# Patient Record
Sex: Female | Born: 1970 | Race: White | Hispanic: No | Marital: Married | Smoking: Former smoker
Health system: Southern US, Community
[De-identification: ages and names within clinical notes are randomized; demographics above are authoritative.]

## PROBLEM LIST (undated history)

## (undated) DIAGNOSIS — F1021 Alcohol dependence, in remission: Secondary | ICD-10-CM

## (undated) DIAGNOSIS — Z8739 Personal history of other diseases of the musculoskeletal system and connective tissue: Secondary | ICD-10-CM

## (undated) DIAGNOSIS — C3490 Malignant neoplasm of unspecified part of unspecified bronchus or lung: Secondary | ICD-10-CM

## (undated) DIAGNOSIS — Z808 Family history of malignant neoplasm of other organs or systems: Secondary | ICD-10-CM

## (undated) DIAGNOSIS — B001 Herpesviral vesicular dermatitis: Secondary | ICD-10-CM

## (undated) DIAGNOSIS — R030 Elevated blood-pressure reading, without diagnosis of hypertension: Secondary | ICD-10-CM

## (undated) DIAGNOSIS — E785 Hyperlipidemia, unspecified: Secondary | ICD-10-CM

## (undated) DIAGNOSIS — Z8249 Family history of ischemic heart disease and other diseases of the circulatory system: Secondary | ICD-10-CM

## (undated) DIAGNOSIS — F419 Anxiety disorder, unspecified: Secondary | ICD-10-CM

## (undated) HISTORY — DX: Anxiety disorder, unspecified: F41.9

## (undated) HISTORY — DX: Personal history of other diseases of the musculoskeletal system and connective tissue: Z87.39

## (undated) HISTORY — DX: Family history of ischemic heart disease and other diseases of the circulatory system: Z82.49

## (undated) HISTORY — DX: Elevated blood-pressure reading, without diagnosis of hypertension: R03.0

## (undated) HISTORY — DX: Hyperlipidemia, unspecified: E78.5

## (undated) HISTORY — DX: Family history of malignant neoplasm of other organs or systems: Z80.8

## (undated) HISTORY — DX: Herpesviral vesicular dermatitis: B00.1

## (undated) HISTORY — PX: NO PAST SURGERIES: SHX2092

## (undated) HISTORY — DX: Alcohol dependence, in remission: F10.21

---

## 2007-10-21 ENCOUNTER — Other Ambulatory Visit: Admission: RE | Admit: 2007-10-21 | Discharge: 2007-10-21 | Payer: Self-pay | Admitting: Family Medicine

## 2010-06-11 ENCOUNTER — Other Ambulatory Visit (HOSPITAL_COMMUNITY)
Admission: RE | Admit: 2010-06-11 | Discharge: 2010-06-11 | Disposition: A | Discharge status: Home / Self Care | Payer: 59 | Source: Ambulatory Visit | Attending: Internal Medicine | Admitting: Internal Medicine

## 2010-06-11 DIAGNOSIS — Z113 Encounter for screening for infections with a predominantly sexual mode of transmission: Secondary | ICD-10-CM | POA: Insufficient documentation

## 2010-06-11 DIAGNOSIS — Z01419 Encounter for gynecological examination (general) (routine) without abnormal findings: Secondary | ICD-10-CM | POA: Insufficient documentation

## 2011-06-14 ENCOUNTER — Other Ambulatory Visit: Payer: Self-pay | Admitting: Family Medicine

## 2011-06-14 DIAGNOSIS — Z1231 Encounter for screening mammogram for malignant neoplasm of breast: Secondary | ICD-10-CM

## 2012-05-19 ENCOUNTER — Ambulatory Visit
Admission: RE | Admit: 2012-05-19 | Discharge: 2012-05-19 | Disposition: A | Discharge status: Home / Self Care | Payer: 59 | Source: Ambulatory Visit | Attending: Family Medicine | Admitting: Family Medicine

## 2012-05-19 DIAGNOSIS — Z1231 Encounter for screening mammogram for malignant neoplasm of breast: Secondary | ICD-10-CM

## 2013-05-06 ENCOUNTER — Other Ambulatory Visit: Payer: Self-pay

## 2013-05-06 DIAGNOSIS — Z1231 Encounter for screening mammogram for malignant neoplasm of breast: Secondary | ICD-10-CM

## 2013-05-24 ENCOUNTER — Ambulatory Visit
Admission: RE | Admit: 2013-05-24 | Discharge: 2013-05-24 | Disposition: A | Discharge status: Home / Self Care | Payer: 59 | Source: Ambulatory Visit

## 2013-05-24 DIAGNOSIS — Z1231 Encounter for screening mammogram for malignant neoplasm of breast: Secondary | ICD-10-CM

## 2016-05-29 ENCOUNTER — Other Ambulatory Visit: Payer: Self-pay | Admitting: Physician Assistant

## 2016-05-29 DIAGNOSIS — Z1231 Encounter for screening mammogram for malignant neoplasm of breast: Secondary | ICD-10-CM

## 2016-06-26 ENCOUNTER — Ambulatory Visit
Admission: RE | Admit: 2016-06-26 | Discharge: 2016-06-26 | Disposition: A | Discharge status: Home / Self Care | Payer: BLUE CROSS/BLUE SHIELD | Source: Ambulatory Visit | Attending: Physician Assistant | Admitting: Physician Assistant

## 2016-06-26 DIAGNOSIS — Z1231 Encounter for screening mammogram for malignant neoplasm of breast: Secondary | ICD-10-CM

## 2017-07-25 ENCOUNTER — Telehealth: Payer: Self-pay

## 2017-07-25 NOTE — Telephone Encounter (Signed)
Notes sent to scheduling [facility] an [facility].

## 2017-09-11 ENCOUNTER — Other Ambulatory Visit: Payer: Self-pay | Admitting: Physician Assistant

## 2017-09-11 DIAGNOSIS — Z1231 Encounter for screening mammogram for malignant neoplasm of breast: Secondary | ICD-10-CM

## 2017-09-29 ENCOUNTER — Encounter: Payer: Self-pay | Admitting: *Deleted

## 2017-09-29 ENCOUNTER — Encounter: Payer: Self-pay | Admitting: Cardiology

## 2017-09-29 ENCOUNTER — Ambulatory Visit (INDEPENDENT_AMBULATORY_CARE_PROVIDER_SITE_OTHER): Payer: BLUE CROSS/BLUE SHIELD | Admitting: Cardiology

## 2017-09-29 VITALS — BP 110/68 | HR 88 | Ht 66.0 in | Wt 142.1 lb

## 2017-09-29 DIAGNOSIS — Z8249 Family history of ischemic heart disease and other diseases of the circulatory system: Secondary | ICD-10-CM

## 2017-09-29 DIAGNOSIS — Z01812 Encounter for preprocedural laboratory examination: Secondary | ICD-10-CM

## 2017-09-29 DIAGNOSIS — I209 Angina pectoris, unspecified: Secondary | ICD-10-CM | POA: Diagnosis not present

## 2017-09-29 MED ORDER — METOPROLOL TARTRATE 50 MG PO TABS: 50.0000 mg | ORAL_TABLET | Freq: Once | ORAL | 0 refills | Status: AC

## 2017-09-29 NOTE — Patient Instructions (Signed)
Medication Instructions:  The current medical regimen is effective;  continue present plan and medications.  Labwork: Please have blood work prior to your CT scan. (BMP)  Testing/Procedures: Your physician has requested that you have coronary CT. Cardiac computed tomography (CT) is a painless test that uses an x-ray machine to take clear, detailed pictures of your heart. For further information please visit [email or web address]. Please follow instruction sheet as given.  Follow-Up: Follow up will be based on the results of the above testing.  Thank you for choosing [facility]!!

## 2017-09-29 NOTE — Progress Notes (Signed)
Cardiology Office Note:    Date:  09/29/2017   ID:  Jill Jensen, DOB [birth year], MRN [id number]  PCP:  Jill Irani, PA-C  Cardiologist:  No primary care provider on file.   Referring MD: Jill Irani, PA-C     History of Present Illness:    Jill Jensen is a 47 y.o. female here for evaluation of possible coronary disease given her family history of early CAD.  Her paternal grandfather died of myocardial infarction in his 30s.  Her father had MI in his 22s with bypass.  He had 5 brothers and they all have coronary artery disease and myocardial infarction.  Her cholesterol levels have been fairly elevated lifelong but she is usually had high LDL levels.  Previous alcohol use, sober for 3 years, goes to Alcoholics Anonymous.  She no longer smokes.  New LDL 158, total cholesterol 237, triglycerides 64, HDL 66.   Past 2 years some anxiety, left sided chest discomfort, squeezing sharp, come and goes, does not have to be exertional. No associated SOB. Nervous. ?Arm discomfort as well. Not during exercise.    Past Medical History:  Diagnosis Date  . Anxiety   . Elevated blood pressure reading without diagnosis of hypertension   . Family history of heart disease   . Family history of thyroid cancer   . H/O scoliosis   . Hyperlipidemia   . Recovering alcoholic (HCC)   . Recurrent cold sores     Past Surgical History:  Procedure Laterality Date  . NO PAST SURGERIES      Current Medications: Current Meds  Medication Sig  . tretinoin (RETIN-A) 0.05 % cream Apply topically at bedtime.  . valACYclovir (VALTREX) 1000 MG tablet Take 1,000 mg by mouth 2 (two) times daily.     Allergies:   Patient has no known allergies.   Social History   Socioeconomic History  . Marital status: Married    Spouse name: Not on file  . Number of children: Not on file  . Years of education: Not on file  . Highest education level: Not on file  Occupational History  . Not on file    Social Needs  . Financial resource strain: Not on file  . Food insecurity:    Worry: Not on file    Inability: Not on file  . Transportation needs:    Medical: Not on file    Non-medical: Not on file  Tobacco Use  . Smoking status: Former Games developer  . Smokeless tobacco: Never Used  Substance and Sexual Activity  . Alcohol use: Never    Frequency: Never  . Drug use: Never  . Sexual activity: Yes    Comment: married  Lifestyle  . Physical activity:    Days per week: Not on file    Minutes per session: Not on file  . Stress: Not on file  Relationships  . Social connections:    Talks on phone: Not on file    Gets together: Not on file    Attends religious service: Not on file    Active member of club or organization: Not on file    Attends meetings of clubs or organizations: Not on file    Relationship status: Not on file  Other Topics Concern  . Not on file  Social History Narrative  . Not on file     Family History: The patient's family history includes Breast cancer in her maternal grandmother; Cancer in her sister; Cancer - Prostate in  her father; Heart attack (age of onset: 3750) in her father; Heart failure in her maternal grandfather; Hyperlipidemia in her father; Hypertension in her mother.  ROS:   Please see the history of present illness.     All other systems reviewed and are negative.  EKGs/Labs/Other Studies Reviewed:    The following studies were reviewed today: Prior office notes, lab work reviewed EKG reviewed.  EKG:  EKG is  ordered today.  The ekg ordered today demonstrates 09/29/2017-sinus rhythm 70 nonspecific T wave flattening mildly low voltage.  Recent Labs: No results found for requested labs within last 8760 hours.  Recent Lipid Panel No results found for: CHOL, TRIG, HDL, CHOLHDL, VLDL, LDLCALC, LDLDIRECT  Physical Exam:    VS:  BP 110/68   Pulse 88   Ht 5\' 6"  (1.676 m)   Wt 142 lb 1.9 oz (64.5 kg)   SpO2 98%   BMI 22.94 kg/m     Wt  Readings from Last 3 Encounters:  09/29/17 142 lb 1.9 oz (64.5 kg)     GEN:  Well nourished, well developed in no acute distress HEENT: Normal NECK: No JVD; No carotid bruits LYMPHATICS: No lymphadenopathy CARDIAC: RRR, no murmurs, rubs, gallops RESPIRATORY:  Clear to auscultation without rales, wheezing or rhonchi  ABDOMEN: Soft, non-tender, non-distended MUSCULOSKELETAL:  No edema; No deformity  SKIN: Warm and dry NEUROLOGIC:  Alert and oriented x 3 PSYCHIATRIC:  Normal affect   ASSESSMENT:    1. Family history of coronary arteriosclerosis   2. Angina pectoris (HCC)   3. Pre-procedure lab exam    PLAN:    In order of problems listed above:  Chest discomfort/angina - Given her strong early family history of CAD, lifelong elevated cholesterol levels, squeezing like left-sided chest discomfort intermittent, I would like to pursue CT scan of coronary arteries with FFR to hopefully exclude coronary artery disease.  Obviously if there is atherosclerosis/calcium, I think given her strong family history I would make sense for us to start a statin therapy at that time.  For now, let us see what the CT scan shows.  Lopressor 50 prior to CT scan.  Continue with healthy eating, non-processed foods, exercise.   Medication Adjustments/Labs and Tests Ordered: Current medicines are reviewed at length with the patient today.  Concerns regarding medicines are outlined above.  Orders Placed This Encounter  Procedures  . CT CORONARY MORPH W/CTA COR W/SCORE W/CA W/CM &/OR WO/CM  . CT CORONARY FRACTIONAL FLOW RESERVE DATA PREP  . CT CORONARY FRACTIONAL FLOW RESERVE FLUID ANALYSIS  . Basic metabolic panel  . EKG 12-Lead   Meds ordered this encounter  Medications  . metoprolol tartrate (LOPRESSOR) 50 MG tablet    Sig: Take 1 tablet (50 mg total) by mouth once for 1 dose. Take 1 tablet 1 hour prior to your CT scan    Dispense:  1 tablet    Refill:  0    Patient Instructions  Medication  Instructions:  The current medical regimen is effective;  continue present plan and medications.  Labwork: Please have blood work prior to your CT scan. (BMP)  Testing/Procedures: Your physician has requested that you have coronary CT. Cardiac computed tomography (CT) is a painless test that uses an x-ray machine to take clear, detailed pictures of your heart. For further information please visit [email or web address]. Please follow instruction sheet as given.  Follow-Up: Follow up will be based on the results of the above testing.  Thank you for choosing [facility]!!        Signed, Donato SchultzMark Lilianna Case, MD  09/29/2017 12:08 PM    [facility]

## 2017-10-03 ENCOUNTER — Ambulatory Visit: Payer: BLUE CROSS/BLUE SHIELD

## 2017-11-18 NOTE — Progress Notes (Signed)
Pt decided she does not want to have this testing as this time.

## 2018-11-27 ENCOUNTER — Other Ambulatory Visit: Payer: Self-pay | Admitting: Physician Assistant

## 2018-11-27 DIAGNOSIS — Z1231 Encounter for screening mammogram for malignant neoplasm of breast: Secondary | ICD-10-CM

## 2018-11-30 ENCOUNTER — Ambulatory Visit
Admission: RE | Admit: 2018-11-30 | Discharge: 2018-11-30 | Disposition: A | Discharge status: Home / Self Care | Payer: BLUE CROSS/BLUE SHIELD | Source: Ambulatory Visit | Attending: Physician Assistant | Admitting: Physician Assistant

## 2018-11-30 ENCOUNTER — Other Ambulatory Visit: Payer: Self-pay

## 2018-11-30 DIAGNOSIS — Z1231 Encounter for screening mammogram for malignant neoplasm of breast: Secondary | ICD-10-CM

## 2019-04-01 ENCOUNTER — Ambulatory Visit: Payer: BC Managed Care – PPO

## 2019-12-14 ENCOUNTER — Other Ambulatory Visit: Payer: Self-pay | Admitting: Physician Assistant

## 2019-12-14 DIAGNOSIS — Z1231 Encounter for screening mammogram for malignant neoplasm of breast: Secondary | ICD-10-CM

## 2019-12-22 ENCOUNTER — Ambulatory Visit
Admission: RE | Admit: 2019-12-22 | Discharge: 2019-12-22 | Disposition: A | Discharge status: Home / Self Care | Payer: BC Managed Care – PPO | Source: Ambulatory Visit

## 2019-12-22 ENCOUNTER — Other Ambulatory Visit: Payer: Self-pay

## 2019-12-22 DIAGNOSIS — Z1231 Encounter for screening mammogram for malignant neoplasm of breast: Secondary | ICD-10-CM

## 2019-12-24 ENCOUNTER — Other Ambulatory Visit: Payer: Self-pay | Admitting: Physician Assistant

## 2019-12-24 DIAGNOSIS — R928 Other abnormal and inconclusive findings on diagnostic imaging of breast: Secondary | ICD-10-CM

## 2020-01-01 ENCOUNTER — Ambulatory Visit
Admission: RE | Admit: 2020-01-01 | Discharge: 2020-01-01 | Disposition: A | Discharge status: Home / Self Care | Payer: BC Managed Care – PPO | Source: Ambulatory Visit | Attending: Physician Assistant | Admitting: Physician Assistant

## 2020-01-01 ENCOUNTER — Ambulatory Visit: Payer: BC Managed Care – PPO

## 2020-01-01 ENCOUNTER — Other Ambulatory Visit: Payer: Self-pay

## 2020-01-01 DIAGNOSIS — R928 Other abnormal and inconclusive findings on diagnostic imaging of breast: Secondary | ICD-10-CM

## 2020-02-24 ENCOUNTER — Other Ambulatory Visit: Payer: BC Managed Care – PPO

## 2021-01-01 ENCOUNTER — Other Ambulatory Visit: Payer: Self-pay | Admitting: Physician Assistant

## 2021-01-01 DIAGNOSIS — Z1231 Encounter for screening mammogram for malignant neoplasm of breast: Secondary | ICD-10-CM

## 2021-02-01 ENCOUNTER — Ambulatory Visit
Admission: RE | Admit: 2021-02-01 | Discharge: 2021-02-01 | Disposition: A | Discharge status: Home / Self Care | Payer: BC Managed Care – PPO | Source: Ambulatory Visit

## 2021-02-01 DIAGNOSIS — Z1231 Encounter for screening mammogram for malignant neoplasm of breast: Secondary | ICD-10-CM

## 2021-04-24 IMAGING — MG MM DIGITAL DIAGNOSTIC UNILAT*R* W/ TOMO W/ CAD
8 series · 9 of 24 positions shown · non-contrast
Comparison: Previous exams.

CLINICAL DATA: Screening recall for possible right breast
asymmetry.

EXAM:
DIGITAL DIAGNOSTIC UNILATERAL RIGHT MAMMOGRAM WITH TOMO AND CAD

[R ML synth-2D (1 of 2)]
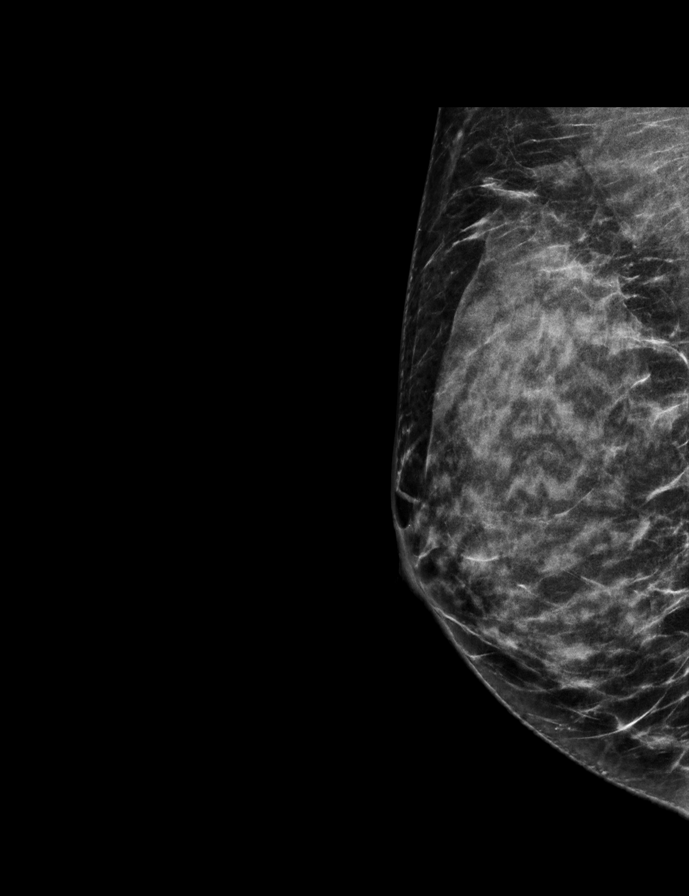

[R MLO synth-2D (1 of 2)]
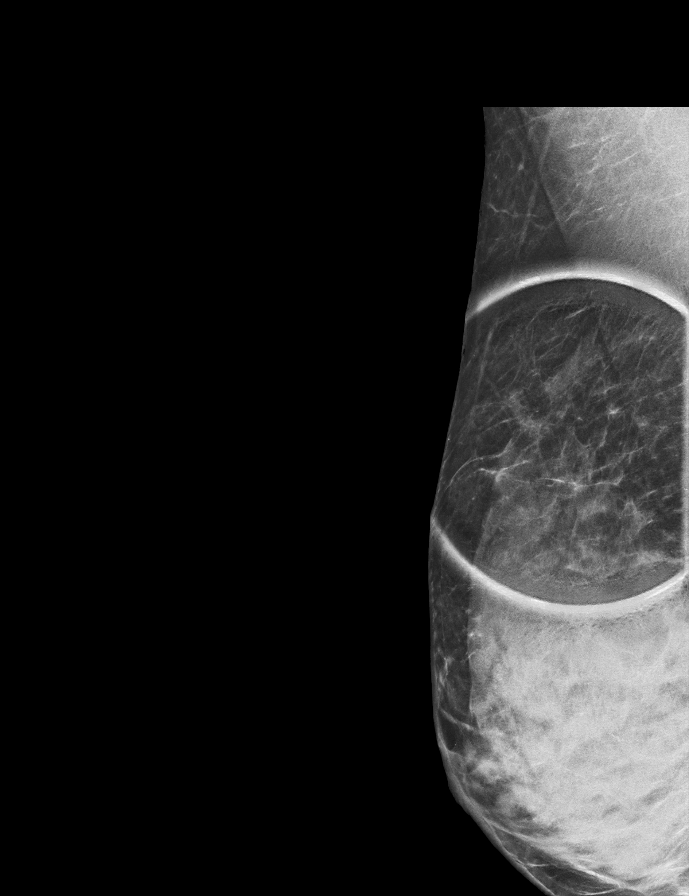

[R ML synth-2D (2 of 2)]
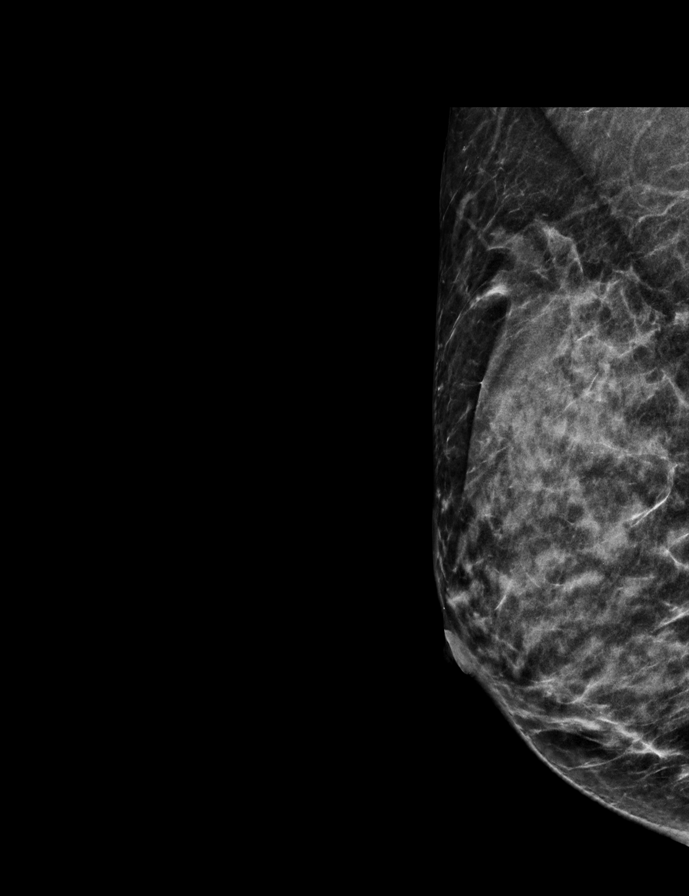

[R MLO synth-2D (2 of 2)]
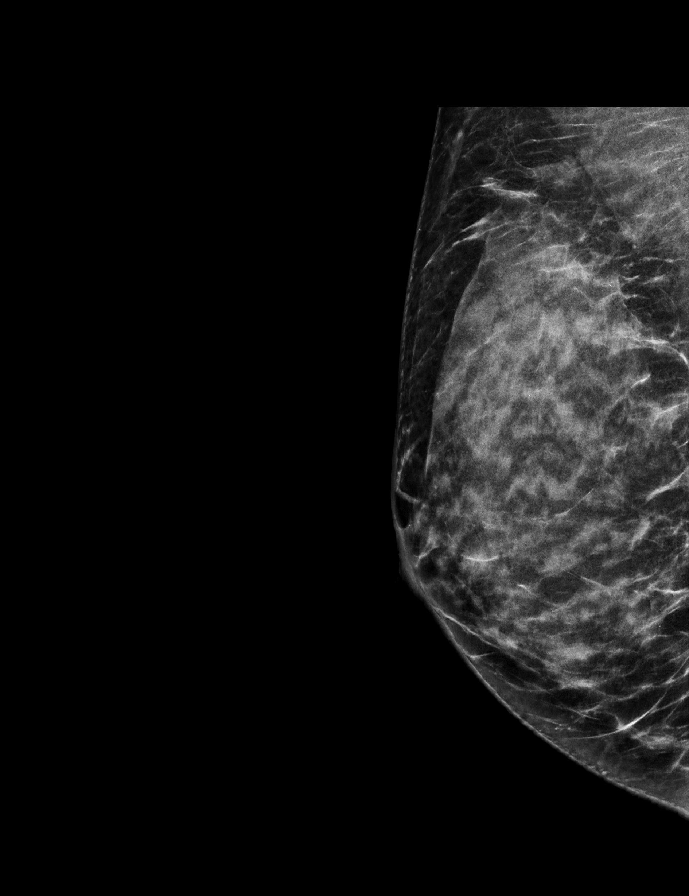

[R ML tomo · 2 of 56 frames shown (1 of 2)]
[frame 19/56]
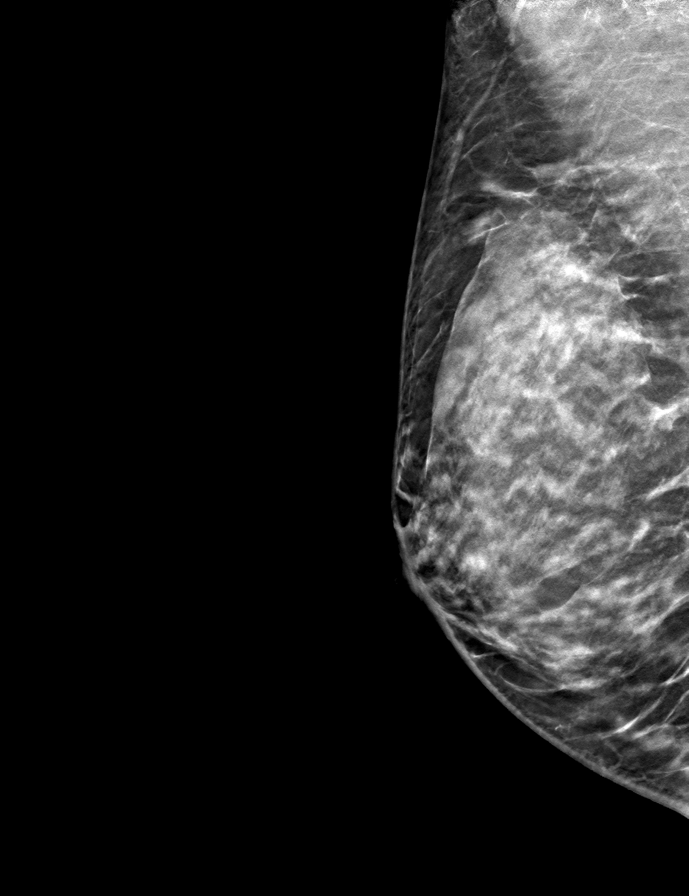
[frame 29/56]
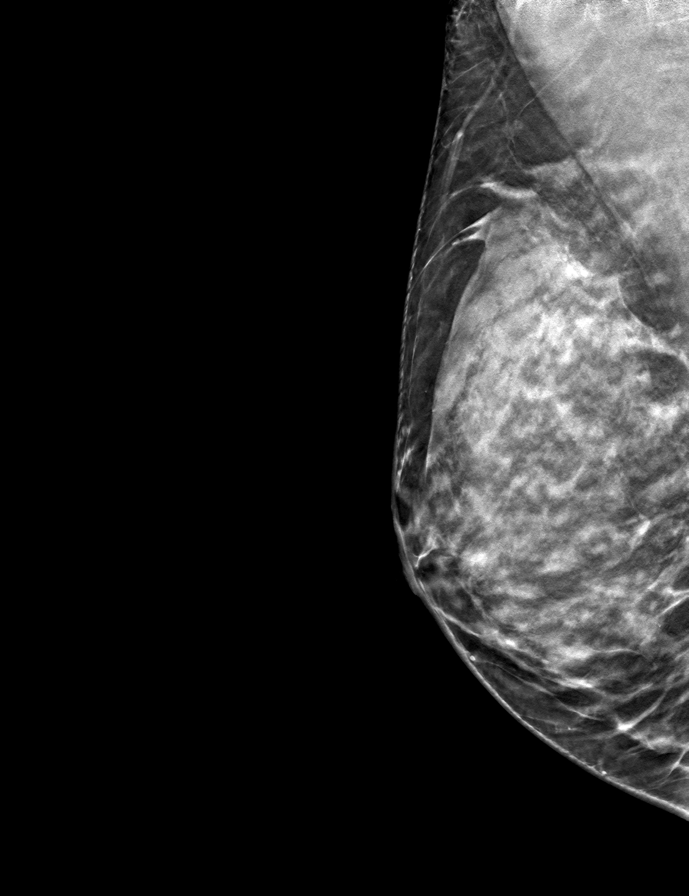

[R MLO tomo (1 of 2) · tomo slice 29/56.0]
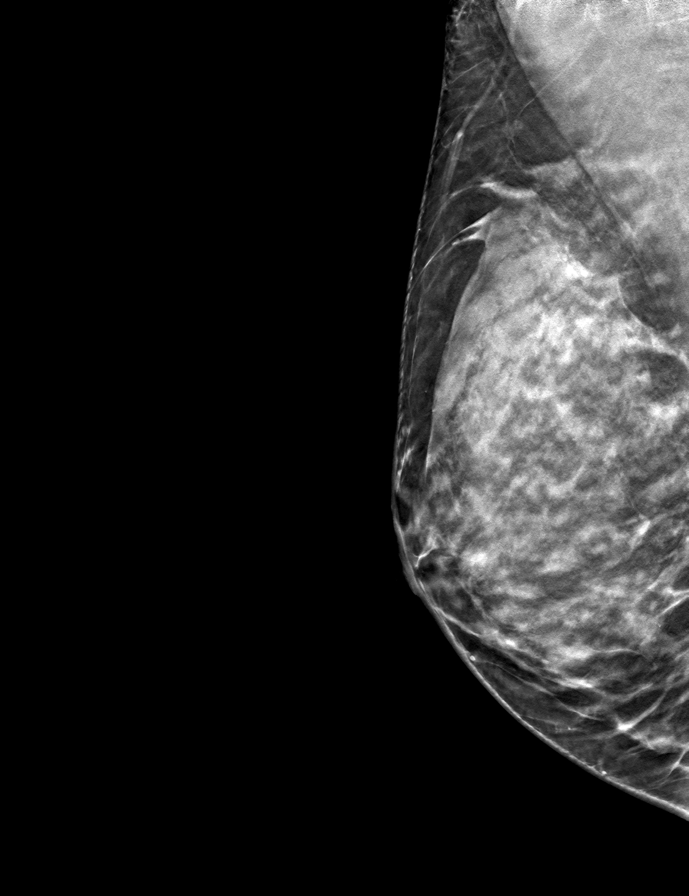

[R MLO tomo (2 of 2) · tomo slice 27/52.0]
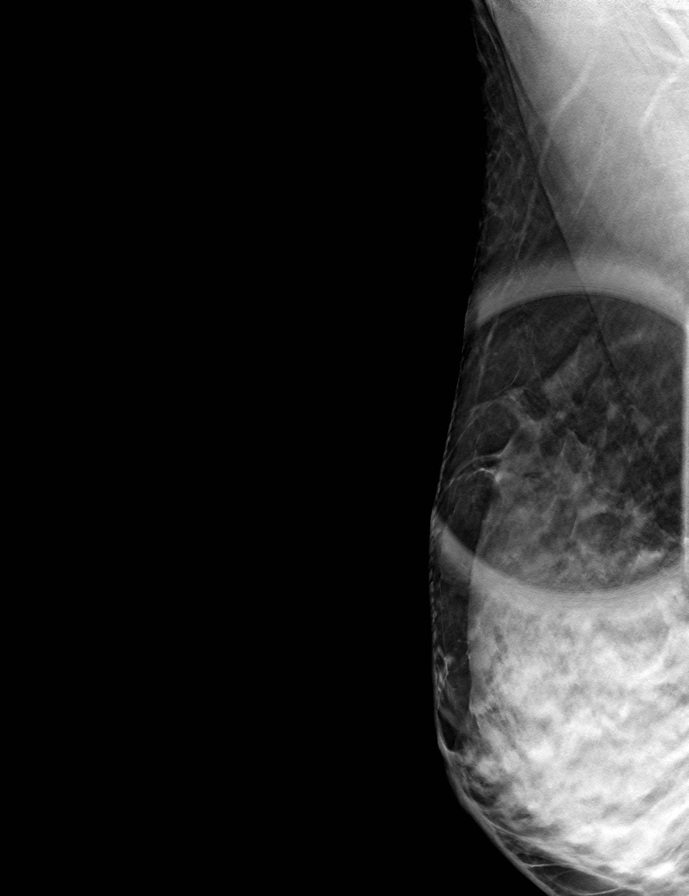

[R ML tomo (2 of 2) · tomo slice 27/53.0]
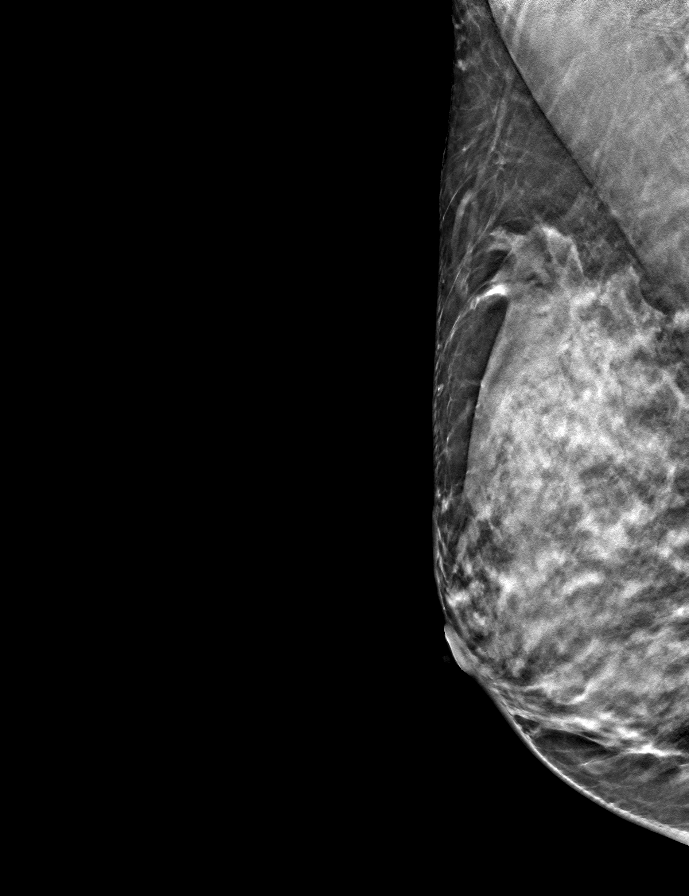

[9 of 24 positions shown; findings below may reference images not displayed]

ACR Breast Density Category d: The breast tissue is extremely dense,
which lowers the sensitivity of mammography.
FINDINGS: Additional tomograms were performed of the right breast. The
initially questioned possible right breast asymmetry resolves on the
additional imaging with findings compatible with an area of
overlapping fibroglandular tissue. There is no mammographic evidence
of malignancy in the right breast.

Mammographic images were processed with CAD.
IMPRESSION: No mammographic evidence of malignancy in the right breast.

RECOMMENDATION:
Screening mammogram in one year.(Code:36-E-BHA)

I have discussed the findings and recommendations with the patient.
If applicable, a reminder letter will be sent to the patient
regarding the next appointment.

BI-RADS CATEGORY  1: Negative.

## 2022-04-18 ENCOUNTER — Encounter (HOSPITAL_BASED_OUTPATIENT_CLINIC_OR_DEPARTMENT_OTHER): Payer: Self-pay | Admitting: Pulmonary Disease

## 2022-04-18 ENCOUNTER — Ambulatory Visit (INDEPENDENT_AMBULATORY_CARE_PROVIDER_SITE_OTHER): Payer: BC Managed Care – PPO | Admitting: Pulmonary Disease

## 2022-04-18 VITALS — BP 110/70 | HR 82 | Wt 134.0 lb

## 2022-04-18 DIAGNOSIS — R911 Solitary pulmonary nodule: Secondary | ICD-10-CM

## 2022-04-18 NOTE — Patient Instructions (Addendum)
RUL nodule, 1 cm No prior imaging available --OBTAIN disc from [place] for PET/CT

## 2022-04-18 NOTE — Telephone Encounter (Signed)
Dr. Loanne Drilling, please see mychart sent by pt.

## 2022-04-18 NOTE — Progress Notes (Addendum)
Subjective:   PATIENT ID: Jill Jensen GENDER: female DOB: 1970/06/15, MRN: [id number]  Chief Complaint  Patient presents with   Consult    Spot on lung    Reason for Visit: New consult for Pulmonary Nodule  Jill Jensen is a 52 year old female with a PMHx of HLD, elevated blood pressures, and alcohol use (8 years ago) who presents after patient was found to have a nodule found on a coronary calcium test. This nodule was an irregular 1 cm nodule in the inferior right upper lobe at the level of the hilum. Patient reports no shortness of breath or chest pain.   Medical History: HLD   Meds: Crestor 5 mg daily, Tretinoin, Valtrex 1000 mg  Family History:Father: Prostate cancer (15 years ago) MI, HLD, All uncles: MI, and HLD, Maternal grandmother (Hormone induced Breast Ca), Sister (Thyroid nodule?)  Social History:  Alcohol: Stopped 8 years  Tob: Social smoker former back 30 years ago (2016, 6-8 weeks of 5 cigaertetes a day)   Did not have an childhood exposures. No current household smokers. No factories near.   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   I have personally reviewed patient's past medical/family/social history, allergies, current medications.  Past Medical History:  Diagnosis Date   Anxiety    Elevated blood pressure reading without diagnosis of hypertension    Family history of heart disease    Family history of thyroid cancer    H/O scoliosis    Hyperlipidemia    Recovering alcoholic ([place])    Recurrent cold sores      Family History  Problem Relation Age of Onset   Breast cancer Maternal Grandmother    Hypertension Mother    Hyperlipidemia Father    Cancer - Prostate Father    Heart attack Father 40       had cabg   Cancer Sister        thyroid   Heart failure Maternal Grandfather      Social History   Occupational History   Not on file  Tobacco Use   Smoking status: Former    Types:  Cigarettes   Smokeless tobacco: Never   Tobacco comments:    Would consider herself a social smoker only at night about 5 to 8 cigarettes a week. Last times smoked 2016  Substance and Sexual Activity   Alcohol use: Never   Drug use: Never   Sexual activity: Yes    Comment: married    No Known Allergies   Outpatient Medications Prior to Visit  Medication Sig Dispense Refill   rosuvastatin (CRESTOR) 5 MG tablet Take 5 mg by mouth daily.     tretinoin (RETIN-A) 0.05 % cream Apply topically at bedtime.     valACYclovir (VALTREX) 1000 MG tablet Take 1,000 mg by mouth 2 (two) times daily.     metoprolol tartrate (LOPRESSOR) 50 MG tablet Take 1 tablet (50 mg total) by mouth once for 1 dose. Take 1 tablet 1 hour prior to your CT scan 1 tablet 0   No facility-administered medications prior to visit.    Review of Systems  Constitutional:  Negative for chills and fever.  Respiratory:  Negative for cough, hemoptysis, shortness of breath and wheezing.   Cardiovascular:  Negative for chest pain.   Objective:   Vitals:   04/18/22 1505  BP: 110/70  Pulse: 82  SpO2: 96%  Weight: 134 lb (60.8 kg)   SpO2: 96 %  O2 Device: None (Room air)  Physical Exam: General: Resting in chair in no acute distress HENT: Normocephalic, atraumatic  Eyes: EOMI Respiratory: CTAB, no wheezing, rales, or ronchi noted  Cardiovascular: RRR, no murmurs, rubs, or gallops Psych: Normal mood, normal affect  Data Reviewed:  Imaging: CT Coronary Calcium scoring 03/27/22: Irregular Right upper 1 cm pulmonary nodules   PFT: N/A  Labs:  N/A    Assessment & Plan:   Discussion: This is a 52 year old female with a past medical history of HLD and elevated blood pressures who presents after incidental finding of irregular 1 cm right upper lobe nodule. Patient is not symptomatic at all. On exam, patient has clear lung sounds. Patient does not have too many concerning risk factors as she is not a long time smoker.  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 As it is this >75m and irregular, will want to proceed with more workup to see if this is actively growing.   1 cm irregular right upper lobe pulmonary nodule -Will proceed with PET scan of chest  -If active will need CT chest and likely navigational bronchoscopy  -Follow up in about 4 weeks after the imaging   Health Maintenance Immunization History  Administered Date(s) Administered   PFIZER(Purple Top)SARS-COV-2 Vaccination 01/14/2020    Orders Placed This Encounter  Procedures   NM PET Image Initial (PI) Skull Base To Thigh    Standing Status:   Future    Standing Expiration Date:   04/18/2023    Order Specific Question:   If indicated for the ordered procedure, I authorize the administration of a radiopharmaceutical per Radiology protocol    Answer:   Yes    Order Specific Question:   Is the patient pregnant?    Answer:   No    Order Specific Question:   [facility]?    [facility]   [facility]  No orders of the defined types were placed in this encounter.   No follow-ups on file.  I have spent a total time of 40-minutes on the day of the appointment reviewing prior documentation, coordinating care and discussing medical diagnosis and plan with the patient/family. Imaging, labs and tests included in this note have been reviewed and interpreted independently by me.  ALeigh Aurora DO [facility] 04/18/2022 3:44 PM  Office Number [phone] Medicine Resident PGY-1

## 2022-04-19 ENCOUNTER — Encounter (HOSPITAL_BASED_OUTPATIENT_CLINIC_OR_DEPARTMENT_OTHER): Payer: Self-pay | Admitting: Pulmonary Disease

## 2022-04-19 NOTE — Telephone Encounter (Signed)
Disc dropped off by pt.

## 2022-04-19 NOTE — Progress Notes (Signed)
See attestation on 04/18/22 note

## 2022-05-01 NOTE — Telephone Encounter (Signed)
CD received and image of CT cardiac calcium scan 03/27/22 uploaded to PACS via [place]. Image is available for viewing in patients chart/ PACS.

## 2022-05-07 ENCOUNTER — Ambulatory Visit (HOSPITAL_COMMUNITY)
Admission: RE | Admit: 2022-05-07 | Discharge: 2022-05-07 | Disposition: A | Discharge status: Home / Self Care | Payer: BC Managed Care – PPO | Source: Ambulatory Visit | Attending: Pulmonary Disease | Admitting: Pulmonary Disease

## 2022-05-07 DIAGNOSIS — R911 Solitary pulmonary nodule: Secondary | ICD-10-CM

## 2022-05-07 LAB — GLUCOSE, CAPILLARY: Glucose-Capillary: 78 mg/dL (ref 70–99)

## 2022-05-07 MED ORDER — FLUDEOXYGLUCOSE F - 18 (FDG) INJECTION
6.6300 | Freq: Once | INTRAVENOUS | Status: AC | PRN
  Administered 2022-05-07: 6.63 via INTRAVENOUS

## 2022-05-09 ENCOUNTER — Ambulatory Visit (INDEPENDENT_AMBULATORY_CARE_PROVIDER_SITE_OTHER): Payer: BC Managed Care – PPO | Admitting: Pulmonary Disease

## 2022-05-09 ENCOUNTER — Encounter (HOSPITAL_BASED_OUTPATIENT_CLINIC_OR_DEPARTMENT_OTHER): Payer: Self-pay | Admitting: Pulmonary Disease

## 2022-05-09 VITALS — BP 110/70 | HR 77 | Ht 66.0 in | Wt 131.4 lb

## 2022-05-09 DIAGNOSIS — R911 Solitary pulmonary nodule: Secondary | ICD-10-CM

## 2022-05-09 NOTE — Progress Notes (Signed)
Subjective:   PATIENT ID: Jill Jensen GENDER: female DOB: 1970-03-30, MRN: [id number]  Chief Complaint  Patient presents with   Follow-up    PET results     Reason for Visit: Follow-up PET  Jill Jensen is a 52 year old female with negligible smoking history with HLD, hx alcohol use (8 years ago) who presents for follow-up for incidental lung nodule. Husband present for support.  Since our last visit, she remains asymptomatic with no respiratory complaints. No weight loss, unexplained fevers/chills, night sweats. CT coronary with irregular 1 cm nodule in RUL was followed up with PET/CT 05/07/22 with 41mm spiculated nodule with minimal FDG avidity.  Social History: Alcohol: Stopped 8 years  Tob: Social smoker former back 30 years ago (2016, 6-8 weeks of 5 cigarettes a day)  Did not have an childhood exposures. No current household smokers.   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    Past Medical History:  Diagnosis Date   Anxiety    Elevated blood pressure reading without diagnosis of hypertension    Family history of heart disease    Family history of thyroid cancer    H/O scoliosis    Hyperlipidemia    Recovering alcoholic ([place])    Recurrent cold sores      Family History  Problem Relation Age of Onset   Breast cancer Maternal Grandmother    Hypertension Mother    Hyperlipidemia Father    Cancer - Prostate Father    Heart attack Father 82       had cabg   Cancer Sister        thyroid   Heart failure Maternal Grandfather      Social History   Occupational History   Not on file  Tobacco Use   Smoking status: Former    Types: Cigarettes   Smokeless tobacco: Never   Tobacco comments:    Would consider herself a social smoker only at night about 5 to 8 cigarettes a week. Last times smoked 2016  Substance and Sexual Activity   Alcohol use: Never   Drug use: Never   Sexual activity: Yes    Comment: married     No Known Allergies   Outpatient Medications Prior to Visit  Medication Sig Dispense Refill   rosuvastatin (CRESTOR) 5 MG tablet Take 5 mg by mouth daily.     tretinoin (RETIN-A) 0.05 % cream Apply topically at bedtime.     valACYclovir (VALTREX) 1000 MG tablet Take 1,000 mg by mouth 2 (two) times daily.     metoprolol tartrate (LOPRESSOR) 50 MG tablet Take 1 tablet (50 mg total) by mouth once for 1 dose. Take 1 tablet 1 hour prior to your CT scan 1 tablet 0   No facility-administered medications prior to visit.    Review of Systems  Constitutional:  Negative for chills, diaphoresis, fever, malaise/fatigue and weight loss.  HENT:  Negative for congestion.   Respiratory:  Negative for cough, hemoptysis, sputum production, shortness of breath and wheezing.   Cardiovascular:  Negative for chest pain, palpitations and leg swelling.   Objective:   Vitals:   05/09/22 1341  BP: 110/70  Pulse: 77  SpO2: 98%  Weight: 131 lb 6.4 oz (59.6 kg)  Height: 5\' 6"  (1.676 m)   SpO2: 98 % O2 Device: None (Room air)  Physical Exam: General: Well-appearing, no acute distress HENT: [place], AT Eyes: EOMI, no scleral icterus Respiratory: Clear to auscultation bilaterally.  No crackles, wheezing or rales Cardiovascular: RRR, -M/R/G, no JVD Extremities:-Edema,-tenderness Neuro: AAO x4, CNII-XII grossly intact Psych: Normal mood, normal affect  Data Reviewed:  Imaging: CT Coronary Calcium scoring 03/27/22: Irregular Right upper 1 cm pulmonary nodules   PFT: N/A  Labs:  CBC No results found for: "WBC", "RBC", "HGB", "HCT", "PLT", "MCV", "MCH", "MCHC", "RDW", "LYMPHSABS", "MONOABS", "EOSABS", "BASOSABS"  Assessment & Plan:   Discussion: 52 year old female with HLD and hx alcohol abuse-8 years ago who presents for follow-up for RUL lung nodule. Asymptomatic. Reviewed PET/CT with minimal FDG uptake for 9-10 mm spiculated nodule. Risk of malignancy 12.2 % based on [facility] solitary nodule  calculator. Discussed risks and benefits of surveillance imaging vs diagnostic testing. Discussed utility in Nodify testing as an additional tool to assess risk of malignancy. Will order CT for 3 months however if results for Nodify are significant, will pursue diagnostic testing.  10 cm irregular right upper lobe pulmonary nodule --Reviewed PET/CT. Faintly hypermetabolic nodule. Unchanged.  --Obtain Nodify testing at [facility], [place] CT chest without contrast in 3 months (June 2024)   Health Maintenance Immunization History  Administered Date(s) Administered   PFIZER(Purple Top)SARS-COV-2 Vaccination 01/14/2020    Orders Placed This Encounter  Procedures   CT Chest Wo Contrast    Standing Status:   Future    Standing Expiration Date:   05/09/2023    Scheduling Instructions:     Schedule in 3 months    Order Specific Question:   Is patient pregnant?    Answer:   No    Order Specific Question:   Preferred imaging location?    Answer:   [facility]  No orders of the defined types were placed in this encounter.   Return in about 3 months (around 08/09/2022) for after CT scan, with Dr. Loanne Drilling.  I have spent a total time of 35-minutes on the day of the appointment including chart review, data review, collecting history, coordinating care and discussing medical diagnosis and plan with the patient/family. Past medical history, allergies, medications were reviewed. Pertinent imaging, labs and tests included in this note have been reviewed and interpreted independently by me.  [place], DO [facility] 05/09/2022 1:47 PM  Office Number [phone] Internal Medicine Resident PGY-1

## 2022-05-09 NOTE — Patient Instructions (Addendum)
Right upper lobe pulmonary nodule 10 mm --Reviewed PET/CT. Faintly hypermetabolic nodule. Unchanged.  --Obtain Nodify testing at [place] CT chest without contrast in 3 months (June 2024)

## 2022-05-15 ENCOUNTER — Encounter (HOSPITAL_BASED_OUTPATIENT_CLINIC_OR_DEPARTMENT_OTHER): Payer: Self-pay | Admitting: Pulmonary Disease

## 2022-05-16 ENCOUNTER — Ambulatory Visit (HOSPITAL_BASED_OUTPATIENT_CLINIC_OR_DEPARTMENT_OTHER): Payer: BC Managed Care – PPO | Admitting: Pulmonary Disease

## 2022-05-20 ENCOUNTER — Encounter (HOSPITAL_BASED_OUTPATIENT_CLINIC_OR_DEPARTMENT_OTHER): Payer: Self-pay | Admitting: Pulmonary Disease

## 2022-05-20 NOTE — Telephone Encounter (Signed)
Lab results, please advise

## 2022-05-23 ENCOUNTER — Encounter (HOSPITAL_BASED_OUTPATIENT_CLINIC_OR_DEPARTMENT_OTHER): Payer: Self-pay | Admitting: Pulmonary Disease

## 2022-05-29 NOTE — Telephone Encounter (Signed)
Patient checking on message to schedule biopsy. Patient phone number is [phone].

## 2022-05-30 NOTE — Telephone Encounter (Signed)
Dr. Everardo All, please advise on pt's message regarding wanting to schedule a biopsy. Thanks.

## 2022-07-26 ENCOUNTER — Ambulatory Visit (HOSPITAL_BASED_OUTPATIENT_CLINIC_OR_DEPARTMENT_OTHER): Payer: BC Managed Care – PPO

## 2022-08-30 ENCOUNTER — Encounter (HOSPITAL_BASED_OUTPATIENT_CLINIC_OR_DEPARTMENT_OTHER): Payer: Self-pay | Admitting: Pulmonary Disease

## 2023-01-28 ENCOUNTER — Other Ambulatory Visit: Payer: Self-pay

## 2023-01-28 ENCOUNTER — Encounter (HOSPITAL_BASED_OUTPATIENT_CLINIC_OR_DEPARTMENT_OTHER): Payer: Self-pay | Admitting: Emergency Medicine

## 2023-01-28 ENCOUNTER — Emergency Department (HOSPITAL_BASED_OUTPATIENT_CLINIC_OR_DEPARTMENT_OTHER)
Admission: EM | Admit: 2023-01-28 | Discharge: 2023-01-29 | Disposition: A | Discharge status: Home / Self Care | Payer: BC Managed Care – PPO | Attending: Emergency Medicine | Admitting: Emergency Medicine

## 2023-01-28 DIAGNOSIS — R079 Chest pain, unspecified: Secondary | ICD-10-CM | POA: Insufficient documentation

## 2023-01-28 DIAGNOSIS — Z87891 Personal history of nicotine dependence: Secondary | ICD-10-CM | POA: Insufficient documentation

## 2023-01-28 DIAGNOSIS — Z85118 Personal history of other malignant neoplasm of bronchus and lung: Secondary | ICD-10-CM | POA: Diagnosis not present

## 2023-01-28 DIAGNOSIS — Y92410 Unspecified street and highway as the place of occurrence of the external cause: Secondary | ICD-10-CM | POA: Diagnosis not present

## 2023-01-28 HISTORY — DX: Malignant neoplasm of unspecified part of unspecified bronchus or lung: C34.90

## 2023-01-28 NOTE — ED Notes (Signed)
Pt complains of mid sternal chest pain, redness noted to mid chest, no crepitus or deformities noted. Chin abrasion, pt denies pain. Right arm bruising noted, pt denies pain in right arm, full range of motion. Right leg bruising and pain, pt able to bare weight. Left shin redness noted, pt reports burning pain. Full range on motion and CNS intact in all extremities.
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
## 2023-01-29 ENCOUNTER — Emergency Department (HOSPITAL_BASED_OUTPATIENT_CLINIC_OR_DEPARTMENT_OTHER): Payer: BC Managed Care – PPO

## 2023-01-29 MED ORDER — NAPROXEN 500 MG PO TABS: 500.0000 mg | ORAL_TABLET | Freq: Two times a day (BID) | ORAL | 0 refills | Status: AC

## 2023-01-29 MED ORDER — OXYCODONE HCL 5 MG PO TABS
5.0000 mg | ORAL_TABLET | Freq: Once | ORAL | Status: AC
  Administered 2023-01-29: 5 mg via ORAL
  Filled 2023-01-29: qty 1

## 2023-01-29 MED ORDER — OXYCODONE HCL 5 MG PO TABS: 5.0000 mg | ORAL_TABLET | ORAL | 0 refills | Status: AC | PRN

## 2023-01-29 MED ORDER — LIDOCAINE 5 % EX PTCH: 1.0000 | MEDICATED_PATCH | CUTANEOUS | 0 refills | Status: AC

## 2023-01-29 NOTE — Discharge Instructions (Signed)
You were evaluated in the Emergency Department and after careful evaluation, we did not find any emergent condition requiring admission or further testing in the hospital.  Your exam/testing today is overall reassuring.  Symptoms likely due to rib bruising.  CT scans did not show any significant injuries or broken bones.  Will be important to take deep breaths throughout the day to keep your lungs healthy and prevent pneumonia.  Use the numbing patches daily.  Use the Naprosyn twice daily for pain.  Can use the oxycodone for more significant pain, use caution as it can cause drowsiness, best used at night if you are having trouble sleeping.  Please return to the Emergency Department if you experience any worsening of your condition.   Thank you for allowing [place] to be a part of your care.
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
